# Patient Record
Sex: Male | Born: 1995 | Race: White | Hispanic: No | Marital: Single | Smoking: Current every day smoker
Health system: Southern US, Community
[De-identification: ages and names within clinical notes are randomized; demographics above are authoritative.]

## PROBLEM LIST (undated history)

## (undated) HISTORY — PX: HERNIA REPAIR: SHX51

## (undated) HISTORY — PX: CIRCUMCISION: SUR203

## (undated) HISTORY — PX: FOOT SURGERY: SHX648

---

## 2018-03-22 ENCOUNTER — Emergency Department
Admission: EM | Admit: 2018-03-22 | Discharge: 2018-03-22 | Disposition: A | Discharge status: Home / Self Care | Payer: Self-pay | Attending: Emergency Medicine | Admitting: Emergency Medicine

## 2018-03-22 ENCOUNTER — Encounter: Payer: Self-pay | Admitting: *Deleted

## 2018-03-22 DIAGNOSIS — K529 Noninfective gastroenteritis and colitis, unspecified: Secondary | ICD-10-CM | POA: Insufficient documentation

## 2018-03-22 DIAGNOSIS — F17210 Nicotine dependence, cigarettes, uncomplicated: Secondary | ICD-10-CM | POA: Insufficient documentation

## 2018-03-22 LAB — CBC
HCT: 41.3 % (ref 39.0–52.0)
Hemoglobin: 14.8 g/dL (ref 13.0–17.0)
MCH: 29.2 pg (ref 26.0–34.0)
MCHC: 35.8 g/dL (ref 30.0–36.0)
MCV: 81.5 fL (ref 80.0–100.0)
Platelets: 191 10*3/uL (ref 150–400)
RBC: 5.07 MIL/uL (ref 4.22–5.81)
RDW: 13.2 % (ref 11.5–15.5)
WBC: 6.8 10*3/uL (ref 4.0–10.5)
nRBC: 0 % (ref 0.0–0.2)

## 2018-03-22 LAB — COMPREHENSIVE METABOLIC PANEL
ALT: 14 U/L (ref 0–44)
AST: 27 U/L (ref 15–41)
Albumin: 4.4 g/dL (ref 3.5–5.0)
Alkaline Phosphatase: 51 U/L (ref 38–126)
Anion gap: 9 (ref 5–15)
BUN: 16 mg/dL (ref 6–20)
CO2: 25 mmol/L (ref 22–32)
Calcium: 9.4 mg/dL (ref 8.9–10.3)
Chloride: 104 mmol/L (ref 98–111)
Creatinine, Ser: 1.09 mg/dL (ref 0.61–1.24)
GFR calc Af Amer: >60 mL/min (ref >60)
GFR calc non Af Amer: >60 mL/min (ref >60)
Glucose, Bld: 116 mg/dL — ABNORMAL HIGH (ref 70–99)
Potassium: 4 mmol/L (ref 3.5–5.1)
Sodium: 138 mmol/L (ref 135–145)
Total Bilirubin: 0.9 mg/dL (ref 0.3–1.2)
Total Protein: 7.4 g/dL (ref 6.5–8.1)

## 2018-03-22 LAB — LIPASE, BLOOD: LIPASE: 29 U/L (ref 11–51)

## 2018-03-22 MED ORDER — ONDANSETRON 4 MG PO TBDP: 4.0000 mg | ORAL_TABLET | Freq: Three times a day (TID) | ORAL | 0 refills | Status: AC | PRN

## 2018-03-22 MED ORDER — FAMOTIDINE 20 MG PO TABS: 20.0000 mg | ORAL_TABLET | Freq: Two times a day (BID) | ORAL | 0 refills | Status: AC

## 2018-03-22 MED ORDER — CIPROFLOXACIN HCL 500 MG PO TABS: 500.0000 mg | ORAL_TABLET | Freq: Two times a day (BID) | ORAL | 0 refills | Status: AC

## 2018-03-22 MED ORDER — SODIUM CHLORIDE 0.9% FLUSH: 3.0000 mL | Freq: Once | INTRAVENOUS | Status: DC

## 2018-03-22 NOTE — ED Triage Notes (Signed)
Pt states that he ate [employer] on Saturday and began having abdominal pain with n/v and diarrhea on Sunday.  Pt appears in no distress, well hydrated and well nourished.  Pt states that he has intermittent nausea and vomiting and at times still has loose stools.  No fever or cough with this, no travel out of the country.

## 2018-03-22 NOTE — ED Notes (Signed)
Pt voices understanding of d/c instructions, medications and follow up.

## 2018-03-22 NOTE — ED Provider Notes (Signed)
[facility] Emergency Department Provider Note  ____________________________________________  Time seen: Approximately 12:44 PM  I have reviewed the triage vital signs and the nursing notes.   HISTORY  Chief Complaint Abdominal Pain    HPI Gregory Adams is a 23 y.o. male with a history of hernia repair who complains of generalized abdominal pain with nausea vomiting and diarrhea for the past 5 days.  This started 1 day after eating at [employer].  Denies any chest pain shortness of breath fevers chills sweats or body aches.  Reports that his diarrhea is improving and has had only one bowel movement today which was semisolid.  He is able to tolerate fluids, but vomits whenever he eats a greasy meal like pizza last night.    History reviewed. No pertinent past medical history.   There are no active problems to display for this patient.    Past Surgical History:  Procedure Laterality Date  . CIRCUMCISION    . FOOT SURGERY    . HERNIA REPAIR       Prior to Admission medications   Medication Sig Start Date End Date Taking? Authorizing Provider  ciprofloxacin (CIPRO) 500 MG tablet Take 1 tablet (500 mg total) by mouth 2 (two) times daily for 3 days. 03/22/18 03/25/18  Sharman Cheek, MD  famotidine (PEPCID) 20 MG tablet Take 1 tablet (20 mg total) by mouth 2 (two) times daily. 03/22/18   Sharman Cheek, MD  ondansetron (ZOFRAN ODT) 4 MG disintegrating tablet Take 1 tablet (4 mg total) by mouth every 8 (eight) hours as needed for nausea or vomiting. 03/22/18   Sharman Cheek, MD     Allergies Patient has no known allergies.   No family history on file.  Social History Social History   Tobacco Use  . Smoking status: Current Every Day Smoker    Types: Cigarettes  . Smokeless tobacco: Never Used  Substance Use Topics  . Alcohol use: Never    Frequency: Never  . Drug use: Yes    Types: Marijuana    Review of Systems  Constitutional:   No  fever or chills.  ENT:   No sore throat. No rhinorrhea. Cardiovascular:   No chest pain or syncope. Respiratory:   No dyspnea or cough. Gastrointestinal: Positive as above for abdominal pain, vomiting and diarrhea.  Musculoskeletal:   Negative for focal pain or swelling All other systems reviewed and are negative except as documented above in ROS and HPI.  ____________________________________________   PHYSICAL EXAM:  VITAL SIGNS: ED Triage Vitals  Enc Vitals Group     BP 03/22/18 1031 138/74     Pulse Rate 03/22/18 1031 (!) 56     Resp 03/22/18 1031 18     Temp 03/22/18 1031 98.4 F (36.9 C)     Temp Source 03/22/18 1031 Oral     SpO2 03/22/18 1031 99 %     Weight 03/22/18 1032 134 lb 6.4 oz (61 kg)     Height 03/22/18 1032 6' (1.829 m)     Head Circumference --      Peak Flow --      Pain Score 03/22/18 1038 0     Pain Loc --      Pain Edu? --      Excl. in GC? --     Vital signs reviewed, nursing assessments reviewed.   Constitutional:   Alert and oriented. Non-toxic appearance. Eyes:   Conjunctivae are normal. EOMI. PERRL. ENT  Head:   Normocephalic and atraumatic.      Nose:   No congestion/rhinnorhea.       Mouth/Throat:   MMM, no pharyngeal erythema. No peritonsillar mass.       Neck:   No meningismus. Full ROM. Hematological/Lymphatic/Immunilogical:   No cervical lymphadenopathy. Cardiovascular:   RRR. Symmetric bilateral radial and DP pulses.  No murmurs. Cap refill less than 2 seconds. Respiratory:   Normal respiratory effort without tachypnea/retractions. Breath sounds are clear and equal bilaterally. No wheezes/rales/rhonchi. Gastrointestinal:   Soft and nontender. Non distended. There is no CVA tenderness.  No rebound, rigidity, or guarding. Musculoskeletal:   Normal range of motion in all extremities. No joint effusions.  No lower extremity tenderness.  No edema. Neurologic:   Normal speech and language.  Motor grossly intact. No acute focal  neurologic deficits are appreciated.  Skin:    Skin is warm, dry and intact. No rash noted.  No petechiae, purpura, or bullae.  ____________________________________________    LABS (pertinent positives/negatives) (all labs ordered are listed, but only abnormal results are displayed) Labs Reviewed  COMPREHENSIVE METABOLIC PANEL - Abnormal; Notable for the following components:      Result Value   Glucose, Bld 116 (*)    All other components within normal limits  LIPASE, BLOOD  CBC  URINALYSIS, COMPLETE (UACMP) WITH MICROSCOPIC   ____________________________________________   EKG    ____________________________________________    RADIOLOGY  No results found.  ____________________________________________   PROCEDURES Procedures  ____________________________________________    CLINICAL IMPRESSION / ASSESSMENT AND PLAN / ED COURSE  Medications ordered in the ED: Medications  sodium chloride flush (NS) 0.9 % injection 3 mL ( Intravenous Canceled Entry 03/22/18 1224)    Pertinent labs & imaging results that were available during my care of the patient were reviewed by me and considered in my medical decision making (see chart for details).    Patient presents with symptoms of foodborne illness or gastroenteritis.  Afebrile, appears to be improving, doubt enteropathogenic or hemorrhagic, no history of bloody bowel movements.  Likely self-limited disease, treat with Zofran and Pepcid.  I will provide a prescription for 3 days of Cipro, advised to take if the diarrhea is persistent for another few days and not improving, but it sounds like he likely will not need this.  Follow-up with primary care.Considering the patient's symptoms, medical history, and physical examination today, I have low suspicion for cholecystitis or biliary pathology, pancreatitis, perforation or bowel obstruction, hernia, intra-abdominal abscess, AAA or dissection, volvulus or intussusception,  mesenteric ischemia, or appendicitis.        ____________________________________________   FINAL CLINICAL IMPRESSION(S) / ED DIAGNOSES    Final diagnoses:  Gastroenteritis  Generalized abdominal pain   ED Discharge Orders         Ordered    famotidine (PEPCID) 20 MG tablet  2 times daily     03/22/18 1243    ondansetron (ZOFRAN ODT) 4 MG disintegrating tablet  Every 8 hours PRN     03/22/18 1243    ciprofloxacin (CIPRO) 500 MG tablet  2 times daily     03/22/18 1243          Portions of this note were generated with dragon dictation software. Dictation errors may occur despite best attempts at proofreading.   Sharman Cheek, MD 03/22/18 1246

## 2018-04-17 ENCOUNTER — Emergency Department: Payer: Self-pay

## 2018-04-17 ENCOUNTER — Other Ambulatory Visit: Payer: Self-pay

## 2018-04-17 ENCOUNTER — Encounter: Payer: Self-pay | Admitting: Emergency Medicine

## 2018-04-17 ENCOUNTER — Emergency Department
Admission: EM | Admit: 2018-04-17 | Discharge: 2018-04-18 | Disposition: A | Discharge status: Home / Self Care | Payer: Self-pay | Attending: Emergency Medicine | Admitting: Emergency Medicine

## 2018-04-17 DIAGNOSIS — F17210 Nicotine dependence, cigarettes, uncomplicated: Secondary | ICD-10-CM | POA: Insufficient documentation

## 2018-04-17 DIAGNOSIS — F1210 Cannabis abuse, uncomplicated: Secondary | ICD-10-CM | POA: Insufficient documentation

## 2018-04-17 DIAGNOSIS — R05 Cough: Secondary | ICD-10-CM | POA: Insufficient documentation

## 2018-04-17 DIAGNOSIS — R059 Cough, unspecified: Secondary | ICD-10-CM

## 2018-04-17 DIAGNOSIS — J40 Bronchitis, not specified as acute or chronic: Secondary | ICD-10-CM | POA: Insufficient documentation

## 2018-04-17 DIAGNOSIS — M791 Myalgia, unspecified site: Secondary | ICD-10-CM | POA: Insufficient documentation

## 2018-04-17 NOTE — ED Triage Notes (Signed)
Patient states he woke up during last night with fever, chills, body aches, and mild cough.

## 2018-04-17 NOTE — ED Notes (Signed)
Pt states he had fever last night had slight headache and fatigue last night

## 2018-04-17 NOTE — ED Provider Notes (Signed)
[facility] Emergency Department Provider Note    First MD Initiated Contact with Patient 04/17/18 2331     (approximate)  I have reviewed the triage vital signs and the nursing notes.   HISTORY  Chief Complaint Fever and Cough   HPI Gregory Adams is a 23 y.o. male with medical history as listed below presents to the emergency department with 1 day history of fever T-max at home 100.0 chills generalized body aches and nonproductive cough.  Patient states sick contact his son currently has conjunctivitis and a cough as well.  Patient does admit to smoking cigarettes daily unsure as to how much per day.  Patient states that he took Tylenol at approximately noon today.   History reviewed. No pertinent past medical history.  There are no active problems to display for this patient.   Past Surgical History:  Procedure Laterality Date  . CIRCUMCISION    . FOOT SURGERY    . HERNIA REPAIR      Prior to Admission medications   Medication Sig Start Date End Date Taking? Authorizing Provider  azithromycin (ZITHROMAX) 500 MG tablet Take 1 tablet (500 mg total) by mouth daily for 3 days. Take 1 tablet daily for 3 days. 04/18/18 04/21/18  Darci Current, MD  benzonatate (TESSALON PERLES) 100 MG capsule Take 1 capsule (100 mg total) by mouth 3 (three) times daily as needed for cough. 04/18/18 04/18/19  Darci Current, MD  famotidine (PEPCID) 20 MG tablet Take 1 tablet (20 mg total) by mouth 2 (two) times daily. 03/22/18   Sharman Cheek, MD  ondansetron (ZOFRAN ODT) 4 MG disintegrating tablet Take 1 tablet (4 mg total) by mouth every 8 (eight) hours as needed for nausea or vomiting. 03/22/18   Sharman Cheek, MD    Allergies Patient has no known allergies.  No family history on file.  Social History Social History   Tobacco Use  . Smoking status: Current Every Day Smoker    Types: Cigarettes  . Smokeless tobacco: Never Used  Substance Use Topics  .  Alcohol use: Never    Frequency: Never  . Drug use: Yes    Types: Marijuana    Review of Systems Constitutional: No fever/chills.  Positive for reported fever Eyes: No visual changes. ENT: No sore throat. Cardiovascular: Denies chest pain. Respiratory: Denies shortness of breath.  Positive for cough Gastrointestinal: No abdominal pain.  No nausea, no vomiting.  No diarrhea.  No constipation. Genitourinary: Negative for dysuria. Musculoskeletal: Negative for neck pain.  Negative for back pain. Integumentary: Negative for rash. Neurological: Negative for headaches, focal weakness or numbness.   ____________________________________________   PHYSICAL EXAM:  VITAL SIGNS: ED Triage Vitals [04/17/18 2241]  Enc Vitals Group     BP 136/75     Pulse Rate (!) 58     Resp 20     Temp 98 F (36.7 C)     Temp Source Oral     SpO2 97 %     Weight 61 kg (134 lb 7.7 oz)     Height 1.829 m (6')     Head Circumference      Peak Flow      Pain Score 7     Pain Loc      Pain Edu?      Excl. in GC?     Constitutional: Alert and oriented. Well appearing and in no acute distress. Eyes: Conjunctivae are normal.  Mouth/Throat: Mucous membranes are moist.  Oropharynx non-erythematous. Neck: No stridor.   Cardiovascular: Normal rate, regular rhythm. Good peripheral circulation. Grossly normal heart sounds. Respiratory: Normal respiratory effort.  No retractions. Lungs CTAB. Gastrointestinal: Soft and nontender. No distention.  Musculoskeletal: No lower extremity tenderness nor edema. No gross deformities of extremities. Neurologic:  Normal speech and language. No gross focal neurologic deficits are appreciated.  Skin:  Skin is warm, dry and intact. No rash noted. Psychiatric: Mood and affect are normal. Speech and behavior are normal.  ____________________________________________   LABS (all labs ordered are listed, but only abnormal results are displayed)  Labs Reviewed  INFLUENZA  PANEL BY PCR (TYPE A & B)   _______________________________________________________________________  RADIOLOGY I, Desloge N Alegandro Macnaughton, personally viewed and evaluated these images (plain radiographs) as part of my medical decision making, as well as reviewing the written report by the radiologist.  ED MD interpretation: Mild hyperinflation seen with asthma or bronchitis per radiologist.  Official radiology report(s): Dg Chest Port 1 View  Result Date: 04/17/2018 CLINICAL DATA:  Cough. Fever and chills. EXAM: PORTABLE CHEST 1 VIEW COMPARISON:  None. FINDINGS: The cardiomediastinal contours are normal. Mild hyperinflation. Pulmonary vasculature is normal. No consolidation, pleural effusion, or pneumothorax. No acute osseous abnormalities are seen. IMPRESSION: Mild hyperinflation which may be voluntary, seen with asthma or bronchitis. No focal airspace disease. Electronically Signed   By: Narda RutherfordMelanie  Sanford M.D.   On: 04/17/2018 23:45     Procedures   ____________________________________________   INITIAL IMPRESSION / MDM / ASSESSMENT AND PLAN / ED COURSE  As part of my medical decision making, I reviewed the following data within the electronic MEDICAL RECORD NUMBER  Gregory Adams was evaluated in Emergency Department on 04/18/2018 for the symptoms described in the history of present illness. He was evaluated in the context of the global COVID-19 pandemic, which necessitated consideration that the patient might be at risk for infection with the SARS-CoV-2 virus that causes COVID-19. Institutional protocols and algorithms that pertain to the evaluation of patients at risk for COVID-19 are in a state of rapid change based on information released by regulatory bodies including the CDC and federal and state organizations. These policies and algorithms were followed during the patient's care in the ED.     23 year old male presenting with above-stated history and physical exam concerning for possible  pneumonia bronchitis viral URI.  Chest x-ray revealed evidence of possible bronchitis which is consistent with patient's clinical picture.  Also considered possibly of COVID-19 however patient does not meet criteria for testing at this time. ____________________________________________  FINAL CLINICAL IMPRESSION(S) / ED DIAGNOSES  Final diagnoses:  Bronchitis     MEDICATIONS GIVEN DURING THIS VISIT:  Medications - No data to display   ED Discharge Orders         Ordered    azithromycin (ZITHROMAX) 500 MG tablet  Daily     04/18/18 0010    benzonatate (TESSALON PERLES) 100 MG capsule  3 times daily PRN     04/18/18 0010           Note:  This document was prepared using Dragon voice recognition software and may include unintentional dictation errors.   Darci CurrentBrown, [place] N, MD 04/18/18 [phone]

## 2018-04-18 LAB — INFLUENZA PANEL BY PCR (TYPE A & B)
Influenza A By PCR: NEGATIVE
Influenza B By PCR: NEGATIVE

## 2018-04-18 MED ORDER — BENZONATATE 100 MG PO CAPS: 100.0000 mg | ORAL_CAPSULE | Freq: Three times a day (TID) | ORAL | 0 refills | Status: AC | PRN

## 2018-04-18 MED ORDER — AZITHROMYCIN 500 MG PO TABS: 500.0000 mg | ORAL_TABLET | Freq: Every day | ORAL | 0 refills | Status: AC

## 2019-05-19 ENCOUNTER — Encounter: Payer: Self-pay | Admitting: Emergency Medicine

## 2019-05-19 ENCOUNTER — Other Ambulatory Visit: Payer: Self-pay

## 2019-05-19 ENCOUNTER — Emergency Department
Admission: EM | Admit: 2019-05-19 | Discharge: 2019-05-19 | Disposition: A | Discharge status: Home / Self Care | Payer: Self-pay | Attending: Emergency Medicine | Admitting: Emergency Medicine

## 2019-05-19 DIAGNOSIS — F17210 Nicotine dependence, cigarettes, uncomplicated: Secondary | ICD-10-CM | POA: Insufficient documentation

## 2019-05-19 DIAGNOSIS — Z79899 Other long term (current) drug therapy: Secondary | ICD-10-CM | POA: Insufficient documentation

## 2019-05-19 DIAGNOSIS — R1110 Vomiting, unspecified: Secondary | ICD-10-CM | POA: Insufficient documentation

## 2019-05-19 DIAGNOSIS — J029 Acute pharyngitis, unspecified: Secondary | ICD-10-CM | POA: Insufficient documentation

## 2019-05-19 DIAGNOSIS — Z20822 Contact with and (suspected) exposure to covid-19: Secondary | ICD-10-CM | POA: Insufficient documentation

## 2019-05-19 DIAGNOSIS — R0981 Nasal congestion: Secondary | ICD-10-CM | POA: Insufficient documentation

## 2019-05-19 DIAGNOSIS — M7918 Myalgia, other site: Secondary | ICD-10-CM | POA: Insufficient documentation

## 2019-05-19 LAB — GROUP A STREP BY PCR: Group A Strep by PCR: NOT DETECTED

## 2019-05-19 NOTE — ED Provider Notes (Signed)
[facility] Emergency Department Provider Note  ____________________________________________   First MD Initiated Contact with Patient 05/19/19 1712     (approximate)  I have reviewed the triage vital signs and the nursing notes.   HISTORY  Chief Complaint Emesis, Sore Throat, Generalized Body Aches, and Chills    HPI Zebulun Deman is a 24 y.o. male presents emergency department complaining of vomiting x1 this morning, chills, body aches, sore throat and some sinus congestion.  Patient states he felt bad yesterday.  States he does not think he had a fever but he did feel hot.  No Covid-like exposure.  He states that he can have strep throat.  He was sent home from work today.    History reviewed. No pertinent past medical history.  There are no problems to display for this patient.   Past Surgical History:  Procedure Laterality Date  . CIRCUMCISION    . FOOT SURGERY    . HERNIA REPAIR      Prior to Admission medications   Medication Sig Start Date End Date Taking? Authorizing Provider  famotidine (PEPCID) 20 MG tablet Take 1 tablet (20 mg total) by mouth 2 (two) times daily. 03/22/18   Sharman Cheek, MD  ondansetron (ZOFRAN ODT) 4 MG disintegrating tablet Take 1 tablet (4 mg total) by mouth every 8 (eight) hours as needed for nausea or vomiting. 03/22/18   Sharman Cheek, MD    Allergies Patient has no known allergies.  History reviewed. No pertinent family history.  Social History Social History   Tobacco Use  . Smoking status: Current Every Day Smoker    Types: Cigarettes  . Smokeless tobacco: Never Used  Substance Use Topics  . Alcohol use: Never  . Drug use: Yes    Types: Marijuana    Review of Systems  Constitutional: Positive fever/chills Eyes: No visual changes. ENT: Positive sore throat. Respiratory: Denies cough Cardiovascular: Denies chest pain Gastrointestinal: Denies abdominal pain Genitourinary: Negative for  dysuria. Musculoskeletal: Negative for back pain. Skin: Negative for rash. Psychiatric: no mood changes,     ____________________________________________   PHYSICAL EXAM:  VITAL SIGNS: ED Triage Vitals  Enc Vitals Group     BP 05/19/19 1538 118/73     Pulse Rate 05/19/19 1538 (!) 53     Resp 05/19/19 1538 18     Temp 05/19/19 1538 97.9 F (36.6 C)     Temp src --      SpO2 05/19/19 1538 100 %     Weight 05/19/19 1539 140 lb (63.5 kg)     Height 05/19/19 1539 6' (1.829 m)     Head Circumference --      Peak Flow --      Pain Score 05/19/19 1539 7     Pain Loc --      Pain Edu? --      Excl. in GC? --     Constitutional: Alert and oriented. Well appearing and in no acute distress. Eyes: Conjunctivae are normal.  Head: Atraumatic. Nose: No congestion/rhinnorhea. Mouth/Throat: Mucous membranes are moist.  Mild redness noted in the posterior pharynx Neck:  supple no lymphadenopathy noted Cardiovascular: Normal rate, regular rhythm. Heart sounds are normal Respiratory: Normal respiratory effort.  No retractions, lungs c t a  Abd: soft nontender bs normal all 4 quad GU: deferred Musculoskeletal: FROM all extremities, warm and well perfused Neurologic:  Normal speech and language.  Skin:  Skin is warm, dry and intact. No rash noted. Psychiatric: Mood  and affect are normal. Speech and behavior are normal.  ____________________________________________   LABS (all labs ordered are listed, but only abnormal results are displayed)  Labs Reviewed  GROUP A STREP BY PCR  SARS CORONAVIRUS 2 (TAT 6-24 HRS)   ____________________________________________   ____________________________________________  RADIOLOGY    ____________________________________________   PROCEDURES  Procedure(s) performed: No  Procedures    ____________________________________________   INITIAL IMPRESSION / ASSESSMENT AND PLAN / ED COURSE  Pertinent labs & imaging results that were  available during my care of the patient were reviewed by me and considered in my medical decision making (see chart for details).   Patient is a 24 year old male presents emergency department with sore throat, fever, body aches, chills and sinus congestion.  He thinks he might have strep throat.  He had no Covid exposure.  No Covid vaccine.  States he works at [employer]  Physical exam patient appears well.  Vitals are normal.  Throat is red but no exudates noted.  No other abnormalities noted remainder of the exam is unremarkable  Strep test and Covid test  Strep test is negative.  Covid test is pending.  I did explain the findings to the patient.  He is to quarantine until he receives his Covid test which should result in 6 to 12 hours.  If negative he can return to work when his symptoms have improved.  If positive he is to quarantine for 12 days.  He states he understands will comply.  He was discharged in stable condition.   Jamyron Redd was evaluated in Emergency Department on 05/19/2019 for the symptoms described in the history of present illness. He was evaluated in the context of the global COVID-19 pandemic, which necessitated consideration that the patient might be at risk for infection with the SARS-CoV-2 virus that causes COVID-19. Institutional protocols and algorithms that pertain to the evaluation of patients at risk for COVID-19 are in a state of rapid change based on information released by regulatory bodies including the CDC and federal and state organizations. These policies and algorithms were followed during the patient's care in the ED.   As part of my medical decision making, I reviewed the following data within the [place] notes reviewed and incorporated, Labs reviewed , Old chart reviewed, Notes from prior ED visits and [place] Controlled Substance Database  ____________________________________________   FINAL CLINICAL IMPRESSION(S) / ED DIAGNOSES   Final diagnoses:  Suspected COVID-19 virus infection      NEW MEDICATIONS STARTED DURING THIS VISIT:  Discharge Medication List as of 05/19/2019  6:54 PM       Note:  This document was prepared using Dragon voice recognition software and may include unintentional dictation errors.    Versie Starks, PA-C 05/19/19 Zollie Beckers, MD 05/20/19 [phone]

## 2019-05-19 NOTE — ED Triage Notes (Signed)
Pt to ER with c/o vomiting x 1 this AM, chills, body aches, sore throat and sinus congestion starting today.  States needs work note as he left work today.

## 2019-05-19 NOTE — Discharge Instructions (Signed)
Follow-up with your regular doctor return emergency department if not improving in 3 days.  Your Covid test should result in 6 to 24 hours.  If it is positive you will need to continue to quarantine for another 12 days.  If negative you may return to work when your symptoms have improved.

## 2019-05-20 LAB — SARS CORONAVIRUS 2 (TAT 6-24 HRS): SARS Coronavirus 2: NEGATIVE

## 2019-07-28 ENCOUNTER — Other Ambulatory Visit: Payer: Self-pay

## 2019-07-28 ENCOUNTER — Emergency Department
Admission: EM | Admit: 2019-07-28 | Discharge: 2019-07-28 | Disposition: A | Discharge status: Home / Self Care | Payer: Self-pay | Attending: Emergency Medicine | Admitting: Emergency Medicine

## 2019-07-28 ENCOUNTER — Encounter: Payer: Self-pay | Admitting: Emergency Medicine

## 2019-07-28 ENCOUNTER — Emergency Department: Payer: Self-pay

## 2019-07-28 DIAGNOSIS — Z5321 Procedure and treatment not carried out due to patient leaving prior to being seen by health care provider: Secondary | ICD-10-CM | POA: Insufficient documentation

## 2019-07-28 DIAGNOSIS — R0602 Shortness of breath: Secondary | ICD-10-CM | POA: Insufficient documentation

## 2019-07-28 LAB — CBC
HCT: 42.5 % (ref 39.0–52.0)
Hemoglobin: 15.7 g/dL (ref 13.0–17.0)
MCH: 29.8 pg (ref 26.0–34.0)
MCHC: 36.9 g/dL — ABNORMAL HIGH (ref 30.0–36.0)
MCV: 80.6 fL (ref 80.0–100.0)
Platelets: 175 10*3/uL (ref 150–400)
RBC: 5.27 MIL/uL (ref 4.22–5.81)
RDW: 13.5 % (ref 11.5–15.5)
WBC: 13.4 10*3/uL — ABNORMAL HIGH (ref 4.0–10.5)
nRBC: 0 % (ref 0.0–0.2)

## 2019-07-28 LAB — BASIC METABOLIC PANEL
Anion gap: 7 (ref 5–15)
BUN: 12 mg/dL (ref 6–20)
CO2: 27 mmol/L (ref 22–32)
Calcium: 9.5 mg/dL (ref 8.9–10.3)
Chloride: 105 mmol/L (ref 98–111)
Creatinine, Ser: 1.13 mg/dL (ref 0.61–1.24)
GFR calc Af Amer: >60 mL/min (ref >60)
GFR calc non Af Amer: >60 mL/min (ref >60)
Glucose, Bld: 103 mg/dL — ABNORMAL HIGH (ref 70–99)
Potassium: 4 mmol/L (ref 3.5–5.1)
Sodium: 139 mmol/L (ref 135–145)

## 2019-07-28 LAB — TROPONIN I (HIGH SENSITIVITY): Troponin I (High Sensitivity): 4 ng/L (ref <18)

## 2019-07-28 NOTE — ED Triage Notes (Addendum)
Patient woke up this evening feeling short of breath. Patient states that he took some OTC medications with no improvement. Patient states that he has had bronchitis and that this feel similar. Patient states that he had also had chest pain earlier in the day.

## 2019-07-29 ENCOUNTER — Telehealth: Payer: Self-pay | Admitting: Emergency Medicine

## 2019-07-29 NOTE — Telephone Encounter (Signed)
Called patient due to lwot to inquire about condition and follow up plans.  No answer and no voicemail  

## 2019-09-17 ENCOUNTER — Other Ambulatory Visit: Payer: Self-pay | Admitting: Physician Assistant

## 2019-09-17 ENCOUNTER — Ambulatory Visit
Admission: RE | Admit: 2019-09-17 | Discharge: 2019-09-17 | Disposition: A | Discharge status: Home / Self Care | Payer: Self-pay | Source: Ambulatory Visit | Attending: Physician Assistant | Admitting: Physician Assistant

## 2019-09-17 ENCOUNTER — Ambulatory Visit
Admission: RE | Admit: 2019-09-17 | Discharge: 2019-09-17 | Disposition: A | Discharge status: Home / Self Care | Payer: Worker's Compensation | Attending: Physician Assistant | Admitting: Physician Assistant

## 2019-09-17 DIAGNOSIS — S6991XA Unspecified injury of right wrist, hand and finger(s), initial encounter: Secondary | ICD-10-CM

## 2020-10-09 ENCOUNTER — Other Ambulatory Visit: Payer: Self-pay

## 2020-10-09 ENCOUNTER — Encounter: Payer: Self-pay | Admitting: Physician Assistant

## 2020-10-09 ENCOUNTER — Ambulatory Visit: Payer: Self-pay | Admitting: Physician Assistant

## 2020-10-09 DIAGNOSIS — F909 Attention-deficit hyperactivity disorder, unspecified type: Secondary | ICD-10-CM | POA: Insufficient documentation

## 2020-10-09 DIAGNOSIS — Z113 Encounter for screening for infections with a predominantly sexual mode of transmission: Secondary | ICD-10-CM

## 2020-10-09 DIAGNOSIS — Z7189 Other specified counseling: Secondary | ICD-10-CM

## 2020-10-09 LAB — HM HIV SCREENING LAB: HM HIV Screening: NEGATIVE

## 2020-10-09 LAB — GRAM STAIN

## 2020-10-09 NOTE — Progress Notes (Signed)
[facility] Department STI clinic/screening visit  Subjective:  Gregory Adams is a 25 y.o. male being seen today for an STI screening visit. The patient reports they do not have symptoms.    Patient has the following medical conditions:  There are no problems to display for this patient.    Chief Complaint  Patient presents with   SEXUALLY TRANSMITTED DISEASE    screening    HPI  Patient reports that he is not having any symptoms but would like a screening today. Reports a history of ADHD, but is no longer on any medicines.  Reports that he has had a hernia repair, finger repair, and surgery for a clipped tongue.  Denies any regular medicines.  States last HIV test was in 2019 and last void prior to sample collection for Gram stain was over 2 hr ago.   Screening for MPX risk: Does the patient have an unexplained rash? No Is the patient MSM? No Does the patient endorse multiple sex partners or anonymous sex partners? No Did the patient have close or sexual contact with a person diagnosed with MPX? No Has the patient traveled outside the [place] where MPX is endemic? No Is there a high clinical suspicion for MPX-- evidenced by one of the following No  -Unlikely to be chickenpox  -Lymphadenopathy  -Rash that present in same phase of evolution on any given body part   See flowsheet for further details and programmatic requirements.    The following portions of the patient's history were reviewed and updated as appropriate: allergies, current medications, past medical history, past social history, past surgical history and problem list.  Objective:  There were no vitals filed for this visit.  Physical Exam Constitutional:      General: He is not in acute distress.    Appearance: Normal appearance.  HENT:     Head: Normocephalic and atraumatic.     Comments: No nits,lice, or hair loss. No cervical, supraclavicular or axillary adenopathy.     Mouth/Throat:      Mouth: Mucous membranes are moist.     Pharynx: Oropharynx is clear. No oropharyngeal exudate or posterior oropharyngeal erythema.  Eyes:     Conjunctiva/sclera: Conjunctivae normal.  Pulmonary:     Effort: Pulmonary effort is normal.  Abdominal:     Palpations: Abdomen is soft. There is no mass.     Tenderness: There is no abdominal tenderness. There is no guarding or rebound.  Genitourinary:    Penis: Normal.      Testes: Normal.     Comments: Pubic area without nits, lice, hair loss, edema, erythema, lesions and inguinal adenopathy. Penis circumcised without rash, lesions and discharge at meatus. Testicles descended bilaterally,nt, no masses or edema.  Musculoskeletal:     Cervical back: Neck supple. No tenderness.  Skin:    General: Skin is warm and dry.     Findings: No bruising, erythema, lesion or rash.  Neurological:     Mental Status: He is alert and oriented to person, place, and time.  Psychiatric:        Mood and Affect: Mood normal.        Behavior: Behavior normal.        Thought Content: Thought content normal.        Judgment: Judgment normal.      Assessment and Plan:  Gregory Adams is a 25 y.o. male presenting to the [facility] Department for STI screening  1. Screening for STD (sexually  transmitted disease) Patient into clinic without symptoms. Reviewed with patient that Gram stain results are normal and no treatment is indicated today.  Rec condoms with all sex. Await test results.  Counseled that RN will call if needs to RTC for treatment once results are back.  - Gram stain - Gonococcus culture - [facility] - Syphilis Serology, [facility]  2. Other specified counseling Patient requested LCSW card to use when ready to do counseling.   Counseled patient re:  LCSW services and that most visits are virtual.     No follow-ups on file.  No future appointments.  Matt Holmes, PA

## 2020-10-13 LAB — GONOCOCCUS CULTURE

## 2021-02-21 IMAGING — CR DG CHEST 2V
1 series · 2 of 2 positions shown · non-contrast
Comparison: April 17, 2018

CLINICAL DATA: Shortness of breath.

EXAM:
CHEST - 2 VIEW

[Series 1: dg chest 2 view · 0.14mm/px · 2 of 2 slices shown]
[im 1/2]
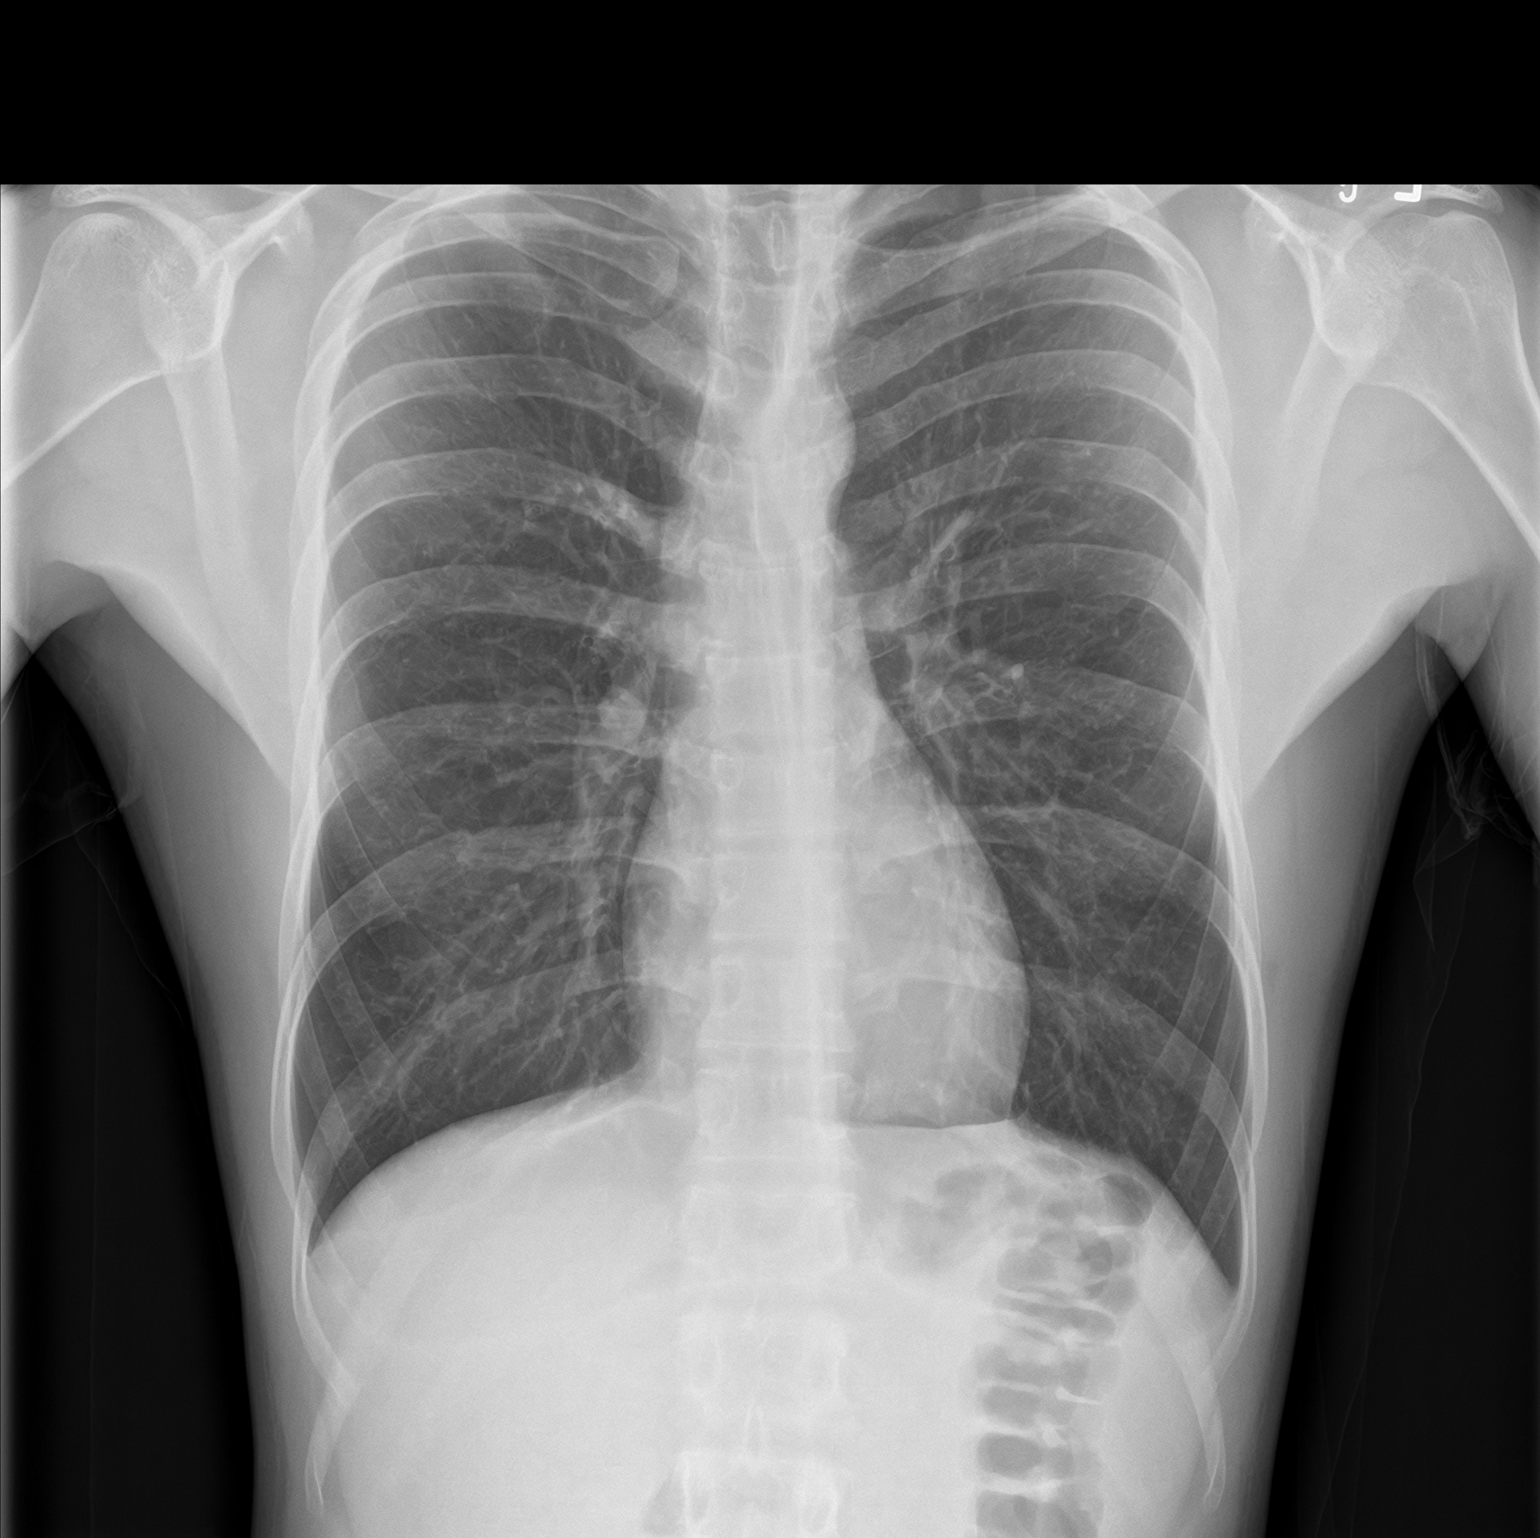
[im 2/2]
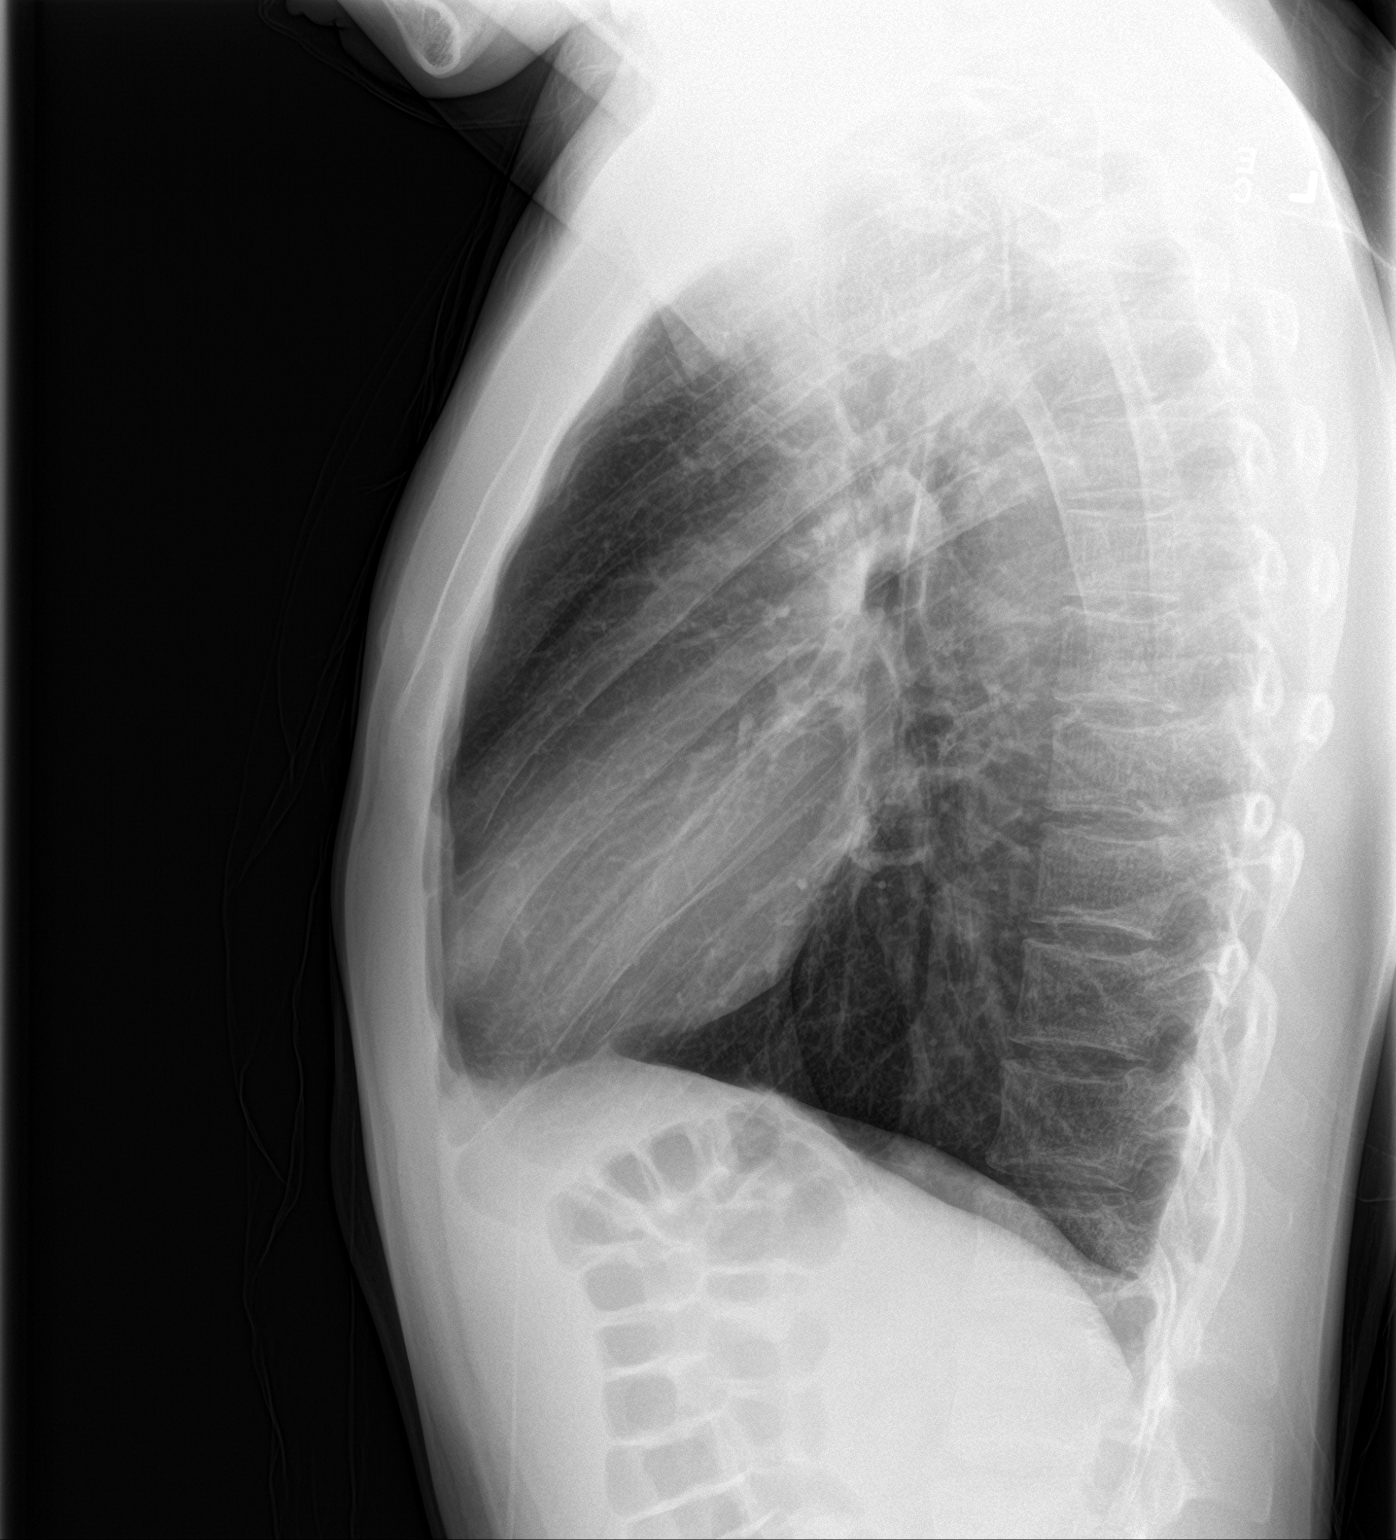

[2 of 2 positions shown; findings below may reference images not displayed]

FINDINGS: There is no evidence of acute infiltrate, pleural effusion or
pneumothorax. The heart size and mediastinal contours are within
normal limits. The visualized skeletal structures are unremarkable.
IMPRESSION: No active cardiopulmonary disease.

## 2021-04-13 IMAGING — CR DG FINGER RING 2+V*R*
1 series · 3 of 3 positions shown · non-contrast
Comparison: None.

CLINICAL DATA: Right fourth finger injury.

EXAM:
RIGHT RING FINGER 2+V

[Series 1: dg finger ring right · 0.14mm/px · 3 of 3 slices shown]
[im 1/3]
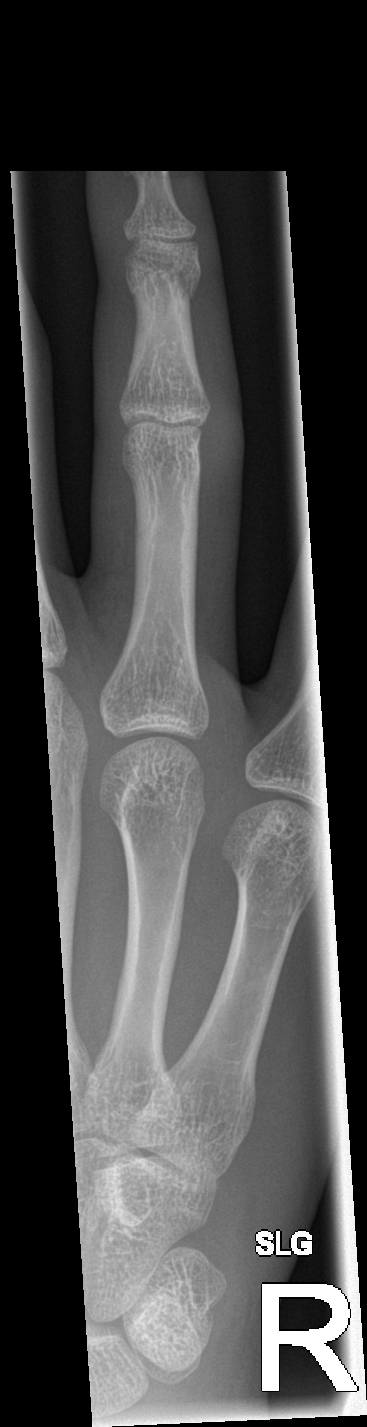
[im 2/3]
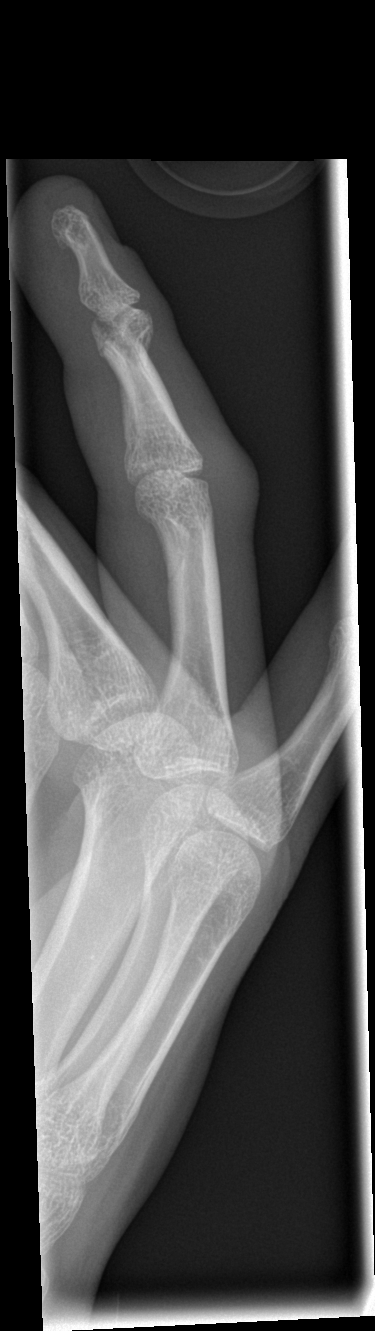
[im 3/3]
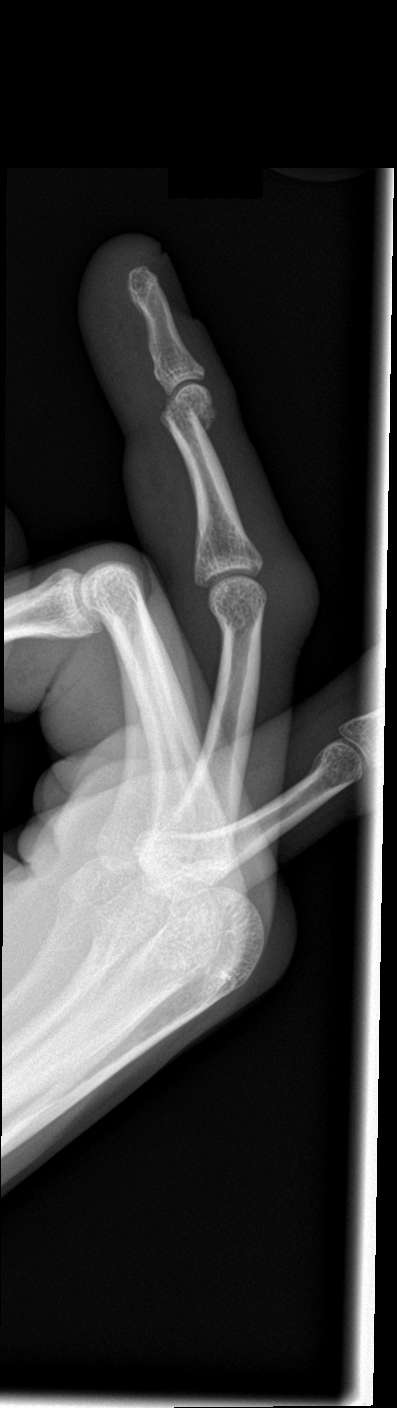

[3 of 3 positions shown; findings below may reference images not displayed]

FINDINGS: Moderately displaced fracture is seen involving the distal portion
of the fourth middle phalanx with possible intra-articular
extension. No other bony abnormality is noted.
IMPRESSION: Moderately displaced fourth middle phalangeal fracture with possible
intra-articular extension.
